# Patient Record
Sex: Female | Born: 1975 | Hispanic: Yes | Smoking: Never smoker
Health system: Southern US, Community
[De-identification: ages and names within clinical notes are randomized; demographics above are authoritative.]

## PROBLEM LIST (undated history)

## (undated) DIAGNOSIS — E7800 Pure hypercholesterolemia, unspecified: Secondary | ICD-10-CM

## (undated) DIAGNOSIS — E119 Type 2 diabetes mellitus without complications: Secondary | ICD-10-CM

## (undated) DIAGNOSIS — D649 Anemia, unspecified: Secondary | ICD-10-CM

---

## 2016-11-08 ENCOUNTER — Emergency Department: Payer: Self-pay

## 2016-11-08 ENCOUNTER — Emergency Department
Admission: EM | Admit: 2016-11-08 | Discharge: 2016-11-09 | Disposition: A | Discharge status: Home / Self Care | Payer: Self-pay | Attending: Emergency Medicine | Admitting: Emergency Medicine

## 2016-11-08 ENCOUNTER — Encounter: Payer: Self-pay | Admitting: *Deleted

## 2016-11-08 ENCOUNTER — Other Ambulatory Visit: Payer: Self-pay

## 2016-11-08 DIAGNOSIS — R079 Chest pain, unspecified: Secondary | ICD-10-CM

## 2016-11-08 DIAGNOSIS — R519 Headache, unspecified: Secondary | ICD-10-CM

## 2016-11-08 DIAGNOSIS — R0602 Shortness of breath: Secondary | ICD-10-CM

## 2016-11-08 DIAGNOSIS — R51 Headache: Secondary | ICD-10-CM | POA: Insufficient documentation

## 2016-11-08 HISTORY — DX: Anemia, unspecified: D64.9

## 2016-11-08 LAB — CBC
HCT: 24.8 % — ABNORMAL LOW (ref 35.0–47.0)
Hemoglobin: 8 g/dL — ABNORMAL LOW (ref 12.0–16.0)
MCH: 18.3 pg — AB (ref 26.0–34.0)
MCHC: 32 g/dL (ref 32.0–36.0)
MCV: 57.1 fL — ABNORMAL LOW (ref 80.0–100.0)
PLATELETS: 406 10*3/uL (ref 150–440)
RBC: 4.35 MIL/uL (ref 3.80–5.20)
RDW: 20.9 % — AB (ref 11.5–14.5)
WBC: 5.6 10*3/uL (ref 3.6–11.0)

## 2016-11-08 LAB — BASIC METABOLIC PANEL
Anion gap: 11 (ref 5–15)
BUN: 9 mg/dL (ref 6–20)
CALCIUM: 9.1 mg/dL (ref 8.9–10.3)
CO2: 28 mmol/L (ref 22–32)
CREATININE: 0.62 mg/dL (ref 0.44–1.00)
Chloride: 102 mmol/L (ref 101–111)
GFR calc Af Amer: >60 mL/min (ref >60)
GFR calc non Af Amer: >60 mL/min (ref >60)
GLUCOSE: 204 mg/dL — AB (ref 65–99)
Potassium: 3.4 mmol/L — ABNORMAL LOW (ref 3.5–5.1)
Sodium: 141 mmol/L (ref 135–145)

## 2016-11-08 LAB — BRAIN NATRIURETIC PEPTIDE: B Natriuretic Peptide: 24 pg/mL (ref 0.0–100.0)

## 2016-11-08 LAB — TROPONIN I

## 2016-11-08 MED ORDER — DIPHENHYDRAMINE HCL 50 MG/ML IJ SOLN
25.0000 mg | Freq: Once | INTRAMUSCULAR | Status: AC
  Administered 2016-11-08: 25 mg via INTRAVENOUS
  Filled 2016-11-08: qty 1

## 2016-11-08 MED ORDER — METOCLOPRAMIDE HCL 5 MG/ML IJ SOLN
10.0000 mg | Freq: Once | INTRAMUSCULAR | Status: AC
  Administered 2016-11-08: 10 mg via INTRAVENOUS
  Filled 2016-11-08: qty 2

## 2016-11-08 NOTE — ED Triage Notes (Signed)
Via video interpreter, pt c/o headache, pain in the neck neck, shoulders, "tight/pressure" chest pain "everything is just hurting". "I am really short of breath". Pt also says that she has dizziness and nausea. Symptoms x 3 days, today worse.

## 2016-11-08 NOTE — ED Notes (Signed)
Pt c/o h/a, neck pain, back pain, bilateral arm numbness. Pt sts that pain been ongoing x 3 days, c/o dizzness and nausea. +PO intake.

## 2016-11-08 NOTE — ED Notes (Addendum)
ED Provider, MD Zenda Alpers, at bedside.

## 2016-11-09 ENCOUNTER — Encounter: Payer: Self-pay | Admitting: Radiology

## 2016-11-09 ENCOUNTER — Emergency Department: Payer: Self-pay

## 2016-11-09 LAB — FIBRIN DERIVATIVES D-DIMER (ARMC ONLY): FIBRIN DERIVATIVES D-DIMER (ARMC): 343.08 (ref 0.00–499.00)

## 2016-11-09 LAB — TROPONIN I

## 2016-11-09 MED ORDER — IOPAMIDOL (ISOVUE-370) INJECTION 76%
125.0000 mL | Freq: Once | INTRAVENOUS | Status: AC | PRN
  Administered 2016-11-09: 125 mL via INTRAVENOUS

## 2016-11-09 MED ORDER — KETOROLAC TROMETHAMINE 30 MG/ML IJ SOLN
30.0000 mg | Freq: Once | INTRAMUSCULAR | Status: AC
  Administered 2016-11-09: 30 mg via INTRAVENOUS
  Filled 2016-11-09: qty 1

## 2016-11-09 MED ORDER — BUTALBITAL-APAP-CAFFEINE 50-325-40 MG PO TABS: 1.0000 | ORAL_TABLET | Freq: Four times a day (QID) | ORAL | 0 refills | Status: AC | PRN

## 2016-11-09 NOTE — ED Provider Notes (Signed)
[facility] Emergency Department Provider Note   ____________________________________________   First MD Initiated Contact with Patient 11/08/16 2326     (approximate)  I have reviewed the triage vital signs and the nursing notes.   HISTORY  Chief Complaint Shortness of Breath    HPI Erika Everett is a 41 y.o. female who comes into the hospital today with a headache chest pain and back pain. She reports that the symptoms have been. Bad for 3 days. She was trying to make an appointment to see her physician but they did not have one for over a month. The patient has been taking Tylenol for the pain but has not been helping. The patient rates her pain a 9 out of 10 in intensity in her head. She reports that at the left side of her head and seems to go around her head. It also seems to go down her left neck and into her shoulder. The patient does not have a history of migraines or headaches like this. She denies any blurred vision but has been having some numbness in her arms as well as weakness all over her body. The patient is also having some shortness of breath with pain in her mid chest radiating through to her back. She's had some nausea and dizziness but no vomiting or abdominal pain. The patient denies any blurred vision. The patient was concerned about the symptoms that she decided to come into the hospital for further evaluation.   Past Medical History:  Diagnosis Date  . Anemia     There are no active problems to display for this patient.   Past Surgical History:  Procedure Laterality Date  . CESAREAN SECTION      Prior to Admission medications   Medication Sig Start Date End Date Taking? Authorizing Provider  butalbital-acetaminophen-caffeine (FIORICET, ESGIC) [phone] MG tablet Take 1-2 tablets by mouth every 6 (six) hours as needed for headache. 11/09/16 11/09/17  Rebecka Apley, MD    Allergies Patient has no known  allergies.  No family history on file.  Social History Social History  Substance Use Topics  . Smoking status: Never Smoker  . Smokeless tobacco: Never Used  . Alcohol use No    Review of Systems  Constitutional: No fever/chills Eyes: No visual changes. ENT: No sore throat. Cardiovascular:  chest pain. Respiratory:  shortness of breath. Gastrointestinal: No abdominal pain.  No nausea, no vomiting.  No diarrhea.  No constipation. Genitourinary: Negative for dysuria. Musculoskeletal:  back pain. Skin: Negative for rash. Neurological: Headache   ____________________________________________   PHYSICAL EXAM:  VITAL SIGNS: ED Triage Vitals  Enc Vitals Group     BP 11/08/16 2148 (!) 156/92     Pulse Rate 11/08/16 2148 88     Resp 11/08/16 2148 18     Temp 11/08/16 2148 98.6 F (37 C)     Temp Source 11/08/16 2148 Oral     SpO2 11/08/16 2148 100 %     Weight 11/08/16 2149 195 lb (88.5 kg)     Height 11/08/16 2149  (1.575 m)     Head Circumference --      Peak Flow --      Pain Score 11/08/16 2146 10     Pain Loc --      Pain Edu? --      Excl. in GC? --     Constitutional: Alert and oriented. Well appearing and in Moderate distress. Eyes: Conjunctivae are normal.  PERRL. EOMI. Head: Atraumatic. Nose: No congestion/rhinnorhea. Mouth/Throat: Mucous membranes are moist.  Oropharynx non-erythematous. Cardiovascular: Normal rate, regular rhythm. Grossly normal heart sounds.  Good peripheral circulation. Respiratory: Normal respiratory effort.  No retractions. Lungs CTAB. Gastrointestinal: Soft and nontender. No distention. Positive bowel sounds Musculoskeletal: No lower extremity tenderness nor edema.   Neurologic:  Normal speech and language. Cranial nerves II through XII are grossly intact with no focal motor or neuro deficits Skin:  Skin is warm, dry and intact. Marland Kitchen Psychiatric: Mood and affect are normal.   ____________________________________________    LABS (all labs ordered are listed, but only abnormal results are displayed)  Labs Reviewed  BASIC METABOLIC PANEL - Abnormal; Notable for the following:       Result Value   Potassium 3.4 (*)    Glucose, Bld 204 (*)    All other components within normal limits  CBC - Abnormal; Notable for the following:    Hemoglobin 8.0 (*)    HCT 24.8 (*)    MCV 57.1 (*)    MCH 18.3 (*)    RDW 20.9 (*)    All other components within normal limits  TROPONIN I  BRAIN NATRIURETIC PEPTIDE  FIBRIN DERIVATIVES D-DIMER ([facility] ONLY)  TROPONIN I   ____________________________________________  EKG  ED ECG REPORT I, Rebecka Apley, the attending physician, personally viewed and interpreted this ECG.   Date: 11/09/2016  EKG Time: 2148  Rate: 85  Rhythm: normal sinus rhythm  Axis: normal  Intervals:none  ST&T Change: none  ____________________________________________  RADIOLOGY  CT head CTA head and neck CTA chest ____________________________________________   PROCEDURES  Procedure(s) performed: None  Procedures  Critical Care performed: No  ____________________________________________   INITIAL IMPRESSION / ASSESSMENT AND PLAN / ED COURSE  Pertinent labs & imaging results that were available during my care of the patient were reviewed by me and considered in my medical decision making (see chart for details).  This is a 41 year old female who comes into the hospital today with some headache chest pain as well as back pain. The patient reports that it has been severe and she is unable to tolerate it. The patient is also had some shortness of breath. The patient is anemic with a hemoglobin of 8.0 and a hematocrit of 24.8. She has had a hemoglobin down to 7.5 back in 2014 but in 2017 the patient's hemoglobin was 10. The patient's ENP is normal and her troponin is unremarkable. The patient's CT is unremarkable as well but I will await the results of her CT angios. The patient did  receive some Reglan, Benadryl and Toradol for her symptoms.  Clinical Course as of Nov 10 418  Mon Nov 08, 2016  2313 No active cardiopulmonary disease. DG Chest 2 View [AW]  Tue Nov 09, 2016  0040 Unremarkable noncontrast CT of the head. CT Head Wo Contrast [AW]  0257 Normal CTA of the head and neck. CT Angio Head W or Wo Contrast [AW]  0321 Artifact versus pulmonary embolus in the anterior basal left lower lobe pulmonary artery branch. Clinical correlation is recommended. V/Q scan may provide better evaluation if there is high clinical concern for PE. No other acute intrathoracic pathology identified.   CT Angio Chest Aorta W and/or Wo Contrast [AW]    Clinical Course User Index [AW] Rebecka Apley, MD    After receiving the medication the patient feels improved. She has had some problems with anemia in the past. I did receive a call  from the radiologist saying that there is a possibility she could have some artifact in the left lower lobe pulmonary artery branch but he could not completely exclude a pulmonary embolus. Again the patient does not have any pleuritic pain. She reports that her breathing and her chest pain is improved at this time. Given that her d-dimer is also negative I will not start the patient on any anticoagulation. Her vital signs are unremarkable with no hypoxia and tachycardia or increased respiratory rate. I feel the patient should follow back up with her primary care physician for further evaluation. I informed the patient of this and she understands and agrees with this plan. I informed her that she should return if her symptoms worsen or she has any other concerns or new symptoms. The patient will be discharged home to follow-up. ____________________________________________   FINAL CLINICAL IMPRESSION(S) / ED DIAGNOSES  Final diagnoses:  Shortness of breath  Chest pain, unspecified type  Nonintractable headache, unspecified chronicity pattern, unspecified  headache type      NEW MEDICATIONS STARTED DURING THIS VISIT:  New Prescriptions   BUTALBITAL-ACETAMINOPHEN-CAFFEINE (FIORICET, ESGIC) [phone] MG TABLET    Take 1-2 tablets by mouth every 6 (six) hours as needed for headache.     Note:  This document was prepared using Dragon voice recognition software and may include unintentional dictation errors.    Rebecka Apley, MD 11/09/16 [phone]

## 2016-11-09 NOTE — ED Notes (Signed)
Report given to Kuch RN 

## 2016-11-09 NOTE — ED Notes (Signed)
Patient transported to CT 

## 2017-02-04 ENCOUNTER — Emergency Department
Admission: EM | Admit: 2017-02-04 | Discharge: 2017-02-05 | Disposition: A | Discharge status: Home / Self Care | Payer: Self-pay | Attending: Emergency Medicine | Admitting: Emergency Medicine

## 2017-02-04 ENCOUNTER — Encounter: Payer: Self-pay | Admitting: Emergency Medicine

## 2017-02-04 DIAGNOSIS — R110 Nausea: Secondary | ICD-10-CM

## 2017-02-04 DIAGNOSIS — R109 Unspecified abdominal pain: Secondary | ICD-10-CM

## 2017-02-04 DIAGNOSIS — R739 Hyperglycemia, unspecified: Secondary | ICD-10-CM

## 2017-02-04 DIAGNOSIS — N926 Irregular menstruation, unspecified: Secondary | ICD-10-CM

## 2017-02-04 LAB — COMPREHENSIVE METABOLIC PANEL
ALBUMIN: 4 g/dL (ref 3.5–5.0)
ALK PHOS: 83 U/L (ref 38–126)
ALT: 60 U/L — ABNORMAL HIGH (ref 14–54)
ANION GAP: 5 (ref 5–15)
AST: 55 U/L — ABNORMAL HIGH (ref 15–41)
BILIRUBIN TOTAL: 0.4 mg/dL (ref 0.3–1.2)
BUN: 11 mg/dL (ref 6–20)
CALCIUM: 9 mg/dL (ref 8.9–10.3)
CO2: 22 mmol/L (ref 22–32)
Chloride: 105 mmol/L (ref 101–111)
Creatinine, Ser: 0.48 mg/dL (ref 0.44–1.00)
GFR calc non Af Amer: >60 mL/min (ref >60)
GLUCOSE: 288 mg/dL — AB (ref 65–99)
POTASSIUM: 3.5 mmol/L (ref 3.5–5.1)
SODIUM: 132 mmol/L — AB (ref 135–145)
TOTAL PROTEIN: 7.8 g/dL (ref 6.5–8.1)

## 2017-02-04 LAB — URINALYSIS, COMPLETE (UACMP) WITH MICROSCOPIC
Bilirubin Urine: NEGATIVE
Glucose, UA: >=500 mg/dL — AB
Ketones, ur: 5 mg/dL — AB
Leukocytes, UA: NEGATIVE
Nitrite: NEGATIVE
PH: 5 (ref 5.0–8.0)
Protein, ur: NEGATIVE mg/dL
SPECIFIC GRAVITY, URINE: 1.029 (ref 1.005–1.030)

## 2017-02-04 LAB — CBC
HEMATOCRIT: 30.3 % — AB (ref 35.0–47.0)
HEMOGLOBIN: 9.5 g/dL — AB (ref 12.0–16.0)
MCH: 19.4 pg — ABNORMAL LOW (ref 26.0–34.0)
MCHC: 31.3 g/dL — ABNORMAL LOW (ref 32.0–36.0)
MCV: 61.9 fL — ABNORMAL LOW (ref 80.0–100.0)
Platelets: 340 10*3/uL (ref 150–440)
RBC: 4.89 MIL/uL (ref 3.80–5.20)
RDW: 21.7 % — ABNORMAL HIGH (ref 11.5–14.5)
WBC: 7.1 10*3/uL (ref 3.6–11.0)

## 2017-02-04 LAB — LIPASE, BLOOD: Lipase: 27 U/L (ref 11–51)

## 2017-02-04 MED ORDER — ONDANSETRON 4 MG PO TBDP
4.0000 mg | ORAL_TABLET | Freq: Once | ORAL | Status: AC | PRN
  Administered 2017-02-04: 4 mg via ORAL
  Filled 2017-02-04: qty 1

## 2017-02-04 NOTE — ED Triage Notes (Signed)
Pt ambulatory to triage with no difficulty. Pt reports approx 1 week of lower abd pain with nausea. Denies vomiting or diarrhea. Pt reprots she normally has heavy periods but for the last 2 months her periods have been irregular and she has been having small amount of bleeding every day.

## 2017-02-05 MED ORDER — DICYCLOMINE HCL 10 MG PO CAPS: 10.0000 mg | ORAL_CAPSULE | Freq: Four times a day (QID) | ORAL | 0 refills | Status: AC | PRN

## 2017-02-05 MED ORDER — ONDANSETRON 4 MG PO TBDP
4.0000 mg | ORAL_TABLET | Freq: Once | ORAL | Status: DC
Start: 2017-02-05 — End: 2017-02-05

## 2017-02-05 MED ORDER — ONDANSETRON 4 MG PO TBDP: ORAL_TABLET | ORAL | 0 refills | Status: AC

## 2017-02-05 NOTE — ED Notes (Signed)
Pt left prior to d/c instruction. Will call and leave message and d/c instruction with first nurse if wanting to pick up. York CeriseForbach MD aware.

## 2017-02-05 NOTE — ED Provider Notes (Signed)
[facility] Emergency Department Provider Note  ____________________________________________   First MD Initiated Contact with Patient 02/05/17 0025     (approximate)  I have reviewed the triage vital signs and the nursing notes.   HISTORY  Chief Complaint Abdominal Pain  The patient and/or family speak(s) Spanish.  They understand they have the right to the use of a hospital interpreter, however at this time they prefer to speak directly with me in Spanish.  They know that they can ask for an interpreter at any time.   HPI Erika Everett is a 41 y.o. female with a medical history of anemia and irregular periods who presents for evaluation of nausea, intermittent lower abdominal cramping, and intermittent dizziness.  The symptoms started about a week ago.  The nausea is persistent and nothing in particular makes it better or worse and she describes it as severe.  She has had no vomiting, diarrhea, nor constipation.  The lower abdominal cramping is intermittent and mild and states Tylenol helps.  She has had a heavy period for about a week and states she has a history of irregular periods.  She feels that the cramping she is experiencing does not feel like it is related to her menstrual cycle.  She denies fever/chills, chest pain, shortness of breath, vomiting, dysuria.  She has had 3 cesarean sections but no other abdominal surgeries.  She has no history of diabetes.   Past Medical History:  Diagnosis Date  . Anemia     There are no active problems to display for this patient.   Past Surgical History:  Procedure Laterality Date  . CESAREAN SECTION      Prior to Admission medications   Medication Sig Start Date End Date Taking? Authorizing Provider  butalbital-acetaminophen-caffeine (FIORICET, ESGIC) [phone] MG tablet Take 1-2 tablets by mouth every 6 (six) hours as needed for headache. 11/09/16 11/09/17  Rebecka Apley, MD  dicyclomine  (BENTYL) 10 MG capsule Take 1 capsule (10 mg total) by mouth 4 (four) times daily as needed for spasms (cramping). 02/05/17 02/19/17  Loleta Rose, MD  ondansetron (ZOFRAN ODT) 4 MG disintegrating tablet Allow 1-2 tablets to dissolve in your mouth every 8 hours as needed for nausea/vomiting 02/05/17   Loleta Rose, MD    Allergies Rubbing alcohol [alcohol]  History reviewed. No pertinent family history.  Social History Social History  Substance Use Topics  . Smoking status: Never Smoker  . Smokeless tobacco: Never Used  . Alcohol use No    Review of Systems Constitutional: No fever/chills Eyes: No visual changes. ENT: No sore throat. Cardiovascular: Denies chest pain. Respiratory: Denies shortness of breath. Gastrointestinal: No abdominal pain.  No nausea, no vomiting.  No diarrhea.  No constipation. Genitourinary: Negative for dysuria. Musculoskeletal: Negative for neck pain.  Negative for back pain. Integumentary: Negative for rash. Neurological: Negative for headaches, focal weakness or numbness.   ____________________________________________   PHYSICAL EXAM:  VITAL SIGNS: ED Triage Vitals  Enc Vitals Group     BP 02/04/17 1916 (!) 145/75     Pulse Rate 02/04/17 1916 92     Resp 02/04/17 1916 20     Temp 02/04/17 1916 99.1 F (37.3 C)     Temp Source 02/04/17 1916 Oral     SpO2 02/04/17 1916 100 %     Weight 02/04/17 1917 88.9 kg (196 lb)     Height 02/04/17 1917 1.6 m (5\' 3" )     Head Circumference --  Peak Flow --      Pain Score 02/04/17 1915 7     Pain Loc --      Pain Edu? --      Excl. in GC? --     Constitutional: Alert and oriented. Well appearing and in no acute distress. Eyes: Conjunctivae are normal.  Head: Atraumatic. Nose: No congestion/rhinnorhea. Mouth/Throat: Mucous membranes are moist. Neck: No stridor.  No meningeal signs.   Cardiovascular: Normal rate, regular rhythm. Good peripheral circulation. Grossly normal heart  sounds. Respiratory: Normal respiratory effort.  No retractions. Lungs CTAB. Gastrointestinal: Soft With mild suprapubic tenderness to palpation.  No rebound and no guarding. GU:  deferred Musculoskeletal: No lower extremity tenderness nor edema. No gross deformities of extremities. Neurologic:  Normal speech and language. No gross focal neurologic deficits are appreciated.  Skin:  Skin is warm, dry and intact. No rash noted. Psychiatric: Mood and affect are normal. Speech and behavior are normal.  ____________________________________________   LABS (all labs ordered are listed, but only abnormal results are displayed)  Labs Reviewed  COMPREHENSIVE METABOLIC PANEL - Abnormal; Notable for the following:       Result Value   Sodium 132 (*)    Glucose, Bld 288 (*)    AST 55 (*)    ALT 60 (*)    All other components within normal limits  CBC - Abnormal; Notable for the following:    Hemoglobin 9.5 (*)    HCT 30.3 (*)    MCV 61.9 (*)    MCH 19.4 (*)    MCHC 31.3 (*)    RDW 21.7 (*)    All other components within normal limits  URINALYSIS, COMPLETE (UACMP) WITH MICROSCOPIC - Abnormal; Notable for the following:    Color, Urine YELLOW (*)    APPearance CLEAR (*)    Glucose, UA >=500 (*)    Hgb urine dipstick MODERATE (*)    Ketones, ur 5 (*)    Bacteria, UA RARE (*)    Squamous Epithelial / LPF 0-5 (*)    All other components within normal limits  LIPASE, BLOOD  POC URINE PREG, ED   ____________________________________________  EKG  None - EKG not ordered by ED physician ____________________________________________  RADIOLOGY   No results found.  ____________________________________________   PROCEDURES  Critical Care performed: No   Procedure(s) performed:   Procedures   ____________________________________________   INITIAL IMPRESSION / ASSESSMENT AND PLAN / ED COURSE  Pertinent labs & imaging results that were available during my care of the patient  were reviewed by me and considered in my medical decision making (see chart for details).  The patient's workup was reassuring today.  I informed her that her glucose level is high and she needs to follow up with her regular doctor for additional testing to determine if she has diabetes.  He is anemic but she has a history of anemia and her hemoglobin is actually considerably higher than the last time it was checked in our system.  I suspect that her symptoms are related to her persistent./Irregular vaginal bleeding.  I offered to order a transvaginal ultrasound as I suspect she may have fibroids or other nonacute but relevant abnormality that may be leading to her symptoms but she is tired because she has been waiting for 5-1/2 hours and wants to go home.  I will provide her with a prescription for Zofran as well as a prescription for Bentyl in case this helps with her abdominal cramping and encouraged her  to follow up both with her primary care doctor and with OB/GYN to discuss her symptoms.  I gave my usual and customary return precautions.  She understands and agrees with the plan.      ____________________________________________  FINAL CLINICAL IMPRESSION(S) / ED DIAGNOSES  Final diagnoses:  Nausea  Abdominal cramping  Irregular menstrual cycle  Hyperglycemia     MEDICATIONS GIVEN DURING THIS VISIT:  Medications  ondansetron (ZOFRAN-ODT) disintegrating tablet 4 mg (not administered)  ondansetron (ZOFRAN-ODT) disintegrating tablet 4 mg (4 mg Oral Given 02/04/17 1928)     NEW OUTPATIENT MEDICATIONS STARTED DURING THIS VISIT:  New Prescriptions   DICYCLOMINE (BENTYL) 10 MG CAPSULE    Take 1 capsule (10 mg total) by mouth 4 (four) times daily as needed for spasms (cramping).   ONDANSETRON (ZOFRAN ODT) 4 MG DISINTEGRATING TABLET    Allow 1-2 tablets to dissolve in your mouth every 8 hours as needed for nausea/vomiting    Modified Medications   No medications on file     Discontinued Medications   No medications on file     Note:  This document was prepared using Dragon voice recognition software and may include unintentional dictation errors.    Loleta RoseForbach, Nathin Saran, MD 02/05/17 [id number]

## 2017-02-05 NOTE — ED Notes (Signed)
Unable to leave VM. D/c instructions given to first nurse.

## 2017-02-05 NOTE — ED Notes (Signed)
Left prior to dc instructions

## 2017-02-05 NOTE — ED Notes (Signed)
Pt not in room. Pt to be d/c per MD order. No answer when called phone.

## 2017-02-05 NOTE — Discharge Instructions (Signed)
Your workup was reassuring today.  The only abnormality was found was that your blood sugar is elevated.  You should follow up with your regular doctor to have additional testing to determine if you have diabetes.  Regarding your irregular period, persistent nausea, and intermittent abdominal cramps, we suggested an ultrasound, but you prefer to follow up as an outpatient.  Please follow up as recommended.    Return to the emergency department if you develop new or worsening symptoms that concern you.

## 2017-02-07 LAB — POCT PREGNANCY, URINE: Preg Test, Ur: NEGATIVE

## 2017-10-21 ENCOUNTER — Emergency Department
Admission: EM | Admit: 2017-10-21 | Discharge: 2017-10-21 | Disposition: A | Discharge status: Home / Self Care | Payer: Self-pay | Attending: Emergency Medicine | Admitting: Emergency Medicine

## 2017-10-21 ENCOUNTER — Emergency Department: Payer: Self-pay

## 2017-10-21 ENCOUNTER — Encounter: Payer: Self-pay | Admitting: Emergency Medicine

## 2017-10-21 ENCOUNTER — Other Ambulatory Visit: Payer: Self-pay

## 2017-10-21 DIAGNOSIS — Z7984 Long term (current) use of oral hypoglycemic drugs: Secondary | ICD-10-CM | POA: Insufficient documentation

## 2017-10-21 DIAGNOSIS — R1012 Left upper quadrant pain: Secondary | ICD-10-CM | POA: Insufficient documentation

## 2017-10-21 DIAGNOSIS — R079 Chest pain, unspecified: Secondary | ICD-10-CM | POA: Insufficient documentation

## 2017-10-21 DIAGNOSIS — Z79899 Other long term (current) drug therapy: Secondary | ICD-10-CM | POA: Insufficient documentation

## 2017-10-21 DIAGNOSIS — E119 Type 2 diabetes mellitus without complications: Secondary | ICD-10-CM | POA: Insufficient documentation

## 2017-10-21 HISTORY — DX: Pure hypercholesterolemia, unspecified: E78.00

## 2017-10-21 HISTORY — DX: Type 2 diabetes mellitus without complications: E11.9

## 2017-10-21 LAB — TROPONIN I: Troponin I: <0.03 ng/mL (ref <0.03)

## 2017-10-21 LAB — CBC
HCT: 28.7 % — ABNORMAL LOW (ref 35.0–47.0)
HEMOGLOBIN: 8.5 g/dL — AB (ref 12.0–16.0)
MCH: 16.5 pg — ABNORMAL LOW (ref 26.0–34.0)
MCHC: 29.8 g/dL — AB (ref 32.0–36.0)
MCV: 55.5 fL — AB (ref 80.0–100.0)
Platelets: 439 10*3/uL (ref 150–440)
RBC: 5.17 MIL/uL (ref 3.80–5.20)
RDW: 25.6 % — ABNORMAL HIGH (ref 11.5–14.5)
WBC: 4.5 10*3/uL (ref 3.6–11.0)

## 2017-10-21 LAB — URINALYSIS, COMPLETE (UACMP) WITH MICROSCOPIC
BACTERIA UA: NONE SEEN
BILIRUBIN URINE: NEGATIVE
Glucose, UA: >=500 mg/dL — AB
KETONES UR: 5 mg/dL — AB
LEUKOCYTES UA: NEGATIVE
Nitrite: NEGATIVE
PROTEIN: NEGATIVE mg/dL
Specific Gravity, Urine: 1.041 — ABNORMAL HIGH (ref 1.005–1.030)
pH: 5 (ref 5.0–8.0)

## 2017-10-21 LAB — BASIC METABOLIC PANEL
ANION GAP: 9 (ref 5–15)
BUN: 7 mg/dL (ref 6–20)
CALCIUM: 9.1 mg/dL (ref 8.9–10.3)
CHLORIDE: 105 mmol/L (ref 101–111)
CO2: 22 mmol/L (ref 22–32)
CREATININE: 0.41 mg/dL — AB (ref 0.44–1.00)
GFR calc non Af Amer: >60 mL/min (ref >60)
GLUCOSE: 288 mg/dL — AB (ref 65–99)
Potassium: 3.8 mmol/L (ref 3.5–5.1)
Sodium: 136 mmol/L (ref 135–145)

## 2017-10-21 LAB — HEPATIC FUNCTION PANEL
ALT: 67 U/L — AB (ref 14–54)
AST: 50 U/L — ABNORMAL HIGH (ref 15–41)
Albumin: 4 g/dL (ref 3.5–5.0)
Alkaline Phosphatase: 87 U/L (ref 38–126)
TOTAL PROTEIN: 7.6 g/dL (ref 6.5–8.1)
Total Bilirubin: 0.4 mg/dL (ref 0.3–1.2)

## 2017-10-21 LAB — LIPASE, BLOOD: LIPASE: 28 U/L (ref 11–51)

## 2017-10-21 LAB — GLUCOSE, CAPILLARY: GLUCOSE-CAPILLARY: 280 mg/dL — AB (ref 65–99)

## 2017-10-21 LAB — POCT PREGNANCY, URINE: Preg Test, Ur: NEGATIVE

## 2017-10-21 MED ORDER — ONDANSETRON HCL 4 MG/2ML IJ SOLN
4.0000 mg | Freq: Once | INTRAMUSCULAR | Status: AC
  Administered 2017-10-21: 4 mg via INTRAVENOUS
  Filled 2017-10-21: qty 2

## 2017-10-21 MED ORDER — IOPAMIDOL (ISOVUE-300) INJECTION 61%
100.0000 mL | Freq: Once | INTRAVENOUS | Status: AC | PRN
  Administered 2017-10-21: 100 mL via INTRAVENOUS

## 2017-10-21 MED ORDER — FAMOTIDINE 20 MG PO TABS: 20.0000 mg | ORAL_TABLET | Freq: Two times a day (BID) | ORAL | 0 refills | Status: AC

## 2017-10-21 MED ORDER — SODIUM CHLORIDE 0.9 % IV BOLUS
1000.0000 mL | Freq: Once | INTRAVENOUS | Status: AC
  Administered 2017-10-21: 1000 mL via INTRAVENOUS

## 2017-10-21 MED ORDER — IOPAMIDOL (ISOVUE-300) INJECTION 61%
30.0000 mL | Freq: Once | INTRAVENOUS | Status: AC
  Administered 2017-10-21: 30 mL via ORAL

## 2017-10-21 MED ORDER — KETOROLAC TROMETHAMINE 30 MG/ML IJ SOLN
15.0000 mg | INTRAMUSCULAR | Status: AC
  Administered 2017-10-21: 15 mg via INTRAVENOUS
  Filled 2017-10-21: qty 1

## 2017-10-21 MED ORDER — METOCLOPRAMIDE HCL 10 MG PO TABS: 10.0000 mg | ORAL_TABLET | Freq: Four times a day (QID) | ORAL | 0 refills | Status: AC | PRN

## 2017-10-21 NOTE — ED Triage Notes (Signed)
Patient presents to the ED with chest pain that began this morning.  Patient reports intermittent weakness and palpitations since yesterday.  Patient states she has history of anemia and diabetes.  Patient reports her chest pain radiates into her neck and head and down into her arm.  Patient reports numbness in last 3 fingers in left hand.

## 2017-10-21 NOTE — Discharge Instructions (Signed)
Your blood tests, chest xray, and CT scan of the abdomen were unremarkable today.  There is no clear cause of your pain today.  It may be related to acid reflux, which can be improved with famotidine and reglan.  Follow up with your doctor and cardiology for further evaluation of your symptoms.

## 2017-10-21 NOTE — ED Triage Notes (Signed)
FIRST NURSE NOTE-here for CP. Pulled for EKG. NAD. Ambulatory.

## 2017-10-21 NOTE — ED Notes (Signed)
Pt c/o "squeezing" chest pain that started this morning around 0600. Pt states that she is still having squeezing pain in her left chest and left back. Pt states that she has had nausea but no vomiting. Pt denies diarrhea. Pt in NAD at this time.

## 2017-10-21 NOTE — ED Notes (Signed)
Patient transported to X-ray 

## 2017-10-21 NOTE — ED Provider Notes (Signed)
[facility] Emergency Department Provider Note  ____________________________________________  Time seen: Approximately 6:41 PM  I have reviewed the triage vital signs and the nursing notes.   HISTORY  Chief Complaint Chest Pain    HPI Erika Everett is a 42 y.o. female with a history of diabetes, high cholesterol, anemia comes the ED complaining of a squeezing chest pain that started at about 6:00 AM this morning, constant, radiating  around the left mid back. when asked to point where the pain is, patient actually indicates the left upper quadrant. No vomiting diaphoresis or shortness of breath. No dizziness or syncope. Discomfort is still present. Worse with eating, no alleviating factors.     Past Medical History:  Diagnosis Date  . Anemia   . Diabetes mellitus without complication (HCC)   . High cholesterol      There are no active problems to display for this patient.    Past Surgical History:  Procedure Laterality Date  . CESAREAN SECTION       Prior to Admission medications   Medication Sig Start Date End Date Taking? Authorizing Provider  metFORMIN (GLUCOPHAGE) 500 MG tablet Take 1,000 mg by mouth 2 (two) times daily with a meal.   Yes [provider]  butalbital-acetaminophen-caffeine (FIORICET, ESGIC) 50-325-40 MG tablet Take 1-2 tablets by mouth every 6 (six) hours as needed for headache. Patient not taking: Reported on 10/21/2017 11/09/16 11/09/17  Rebecka ApleyWebster, Allison P, MD  dicyclomine (BENTYL) 10 MG capsule Take 1 capsule (10 mg total) by mouth 4 (four) times daily as needed for spasms (cramping). 02/05/17 02/19/17  Loleta RoseForbach, Cory, MD  famotidine (PEPCID) 20 MG tablet Take 1 tablet (20 mg total) by mouth 2 (two) times daily. 10/21/17   Sharman CheekStafford, Dutch Ing, MD  metoCLOPramide (REGLAN) 10 MG tablet Take 1 tablet (10 mg total) by mouth every 6 (six) hours as needed. 10/21/17   Sharman CheekStafford, Daren Doswell, MD  ondansetron (ZOFRAN ODT) 4 MG  disintegrating tablet Allow 1-2 tablets to dissolve in your mouth every 8 hours as needed for nausea/vomiting Patient not taking: Reported on 10/21/2017 02/05/17   Loleta RoseForbach, Cory, MD     Allergies Rubbing alcohol [alcohol]   No family history on file.  Social History Social History   Tobacco Use  . Smoking status: Never Smoker  . Smokeless tobacco: Never Used  Substance Use Topics  . Alcohol use: No  . Drug use: No    Review of Systems  Constitutional:   No fever or chills.  ENT:   No sore throat. No rhinorrhea. Cardiovascular:   negative for chest pain or syncope. Respiratory:   No dyspnea or cough. Gastrointestinal:  positive as above abdominal pain without vomiting and diarrhea.  Musculoskeletal:   Negative for focal pain or swelling All other systems reviewed and are negative except as documented above in ROS and HPI.  ____________________________________________   PHYSICAL EXAM:  VITAL SIGNS: ED Triage Vitals  Enc Vitals Group     BP 10/21/17 1533 (!) 141/89     Pulse Rate 10/21/17 1533 81     Resp 10/21/17 1533 18     Temp 10/21/17 1533 98.3 F (36.8 C)     Temp Source 10/21/17 1533 Oral     SpO2 10/21/17 1533 100 %     Weight 10/21/17 1534 187 lb (84.8 kg)     Height 10/21/17 1534 5\' 2"  (1.575 m)     Head Circumference --      Peak Flow --  Pain Score 10/21/17 1534 7     Pain Loc --      Pain Edu? --      Excl. in GC? --     Vital signs reviewed, nursing assessments reviewed.   Constitutional:   Alert and oriented. Well appearing and in no distress. Eyes:   Conjunctivae are normal. EOMI. PERRL. ENT      Head:   Normocephalic and atraumatic.      Nose:   No congestion/rhinnorhea.       Mouth/Throat:   MMM, no pharyngeal erythema. No peritonsillar mass.       Neck:   No meningismus. Full ROM. Hematological/Lymphatic/Immunilogical:   No cervical lymphadenopathy. Cardiovascular:   RRR. Symmetric bilateral radial and DP pulses.  No murmurs.   Respiratory:   Normal respiratory effort without tachypnea/retractions. Breath sounds are clear and equal bilaterally. No wheezes/rales/rhonchi. Gastrointestinal:   Soft with left upper quadrant and left mid abdominal tenderness. Non distended. There is no CVA tenderness.  No rebound, rigidity, or guarding. Musculoskeletal:   Normal range of motion in all extremities. No joint effusions.  No lower extremity tenderness.  No edema.there is tenderness in the left mid back in the area of indicated pain and reproducing the symptoms. No bony point tenderness, no crepitus or chest wall instability. No midline spinal tenderness Neurologic:   Normal speech and language.  Motor grossly intact. No acute focal neurologic deficits are appreciated.  Skin:    Skin is warm, dry and intact. No rash noted.  No petechiae, purpura, or bullae.  ____________________________________________    LABS (pertinent positives/negatives) (all labs ordered are listed, but only abnormal results are displayed) Labs Reviewed  BASIC METABOLIC PANEL - Abnormal; Notable for the following components:      Result Value   Glucose, Bld 288 (*)    Creatinine, Ser 0.41 (*)    All other components within normal limits  CBC - Abnormal; Notable for the following components:   Hemoglobin 8.5 (*)    HCT 28.7 (*)    MCV 55.5 (*)    MCH 16.5 (*)    MCHC 29.8 (*)    RDW 25.6 (*)    All other components within normal limits  URINALYSIS, COMPLETE (UACMP) WITH MICROSCOPIC - Abnormal; Notable for the following components:   Color, Urine YELLOW (*)    APPearance CLEAR (*)    Specific Gravity, Urine 1.041 (*)    Glucose, UA >=500 (*)    Hgb urine dipstick LARGE (*)    Ketones, ur 5 (*)    Squamous Epithelial / LPF 0-5 (*)    All other components within normal limits  GLUCOSE, CAPILLARY - Abnormal; Notable for the following components:   Glucose-Capillary 280 (*)    All other components within normal limits  HEPATIC FUNCTION PANEL -  Abnormal; Notable for the following components:   AST 50 (*)    ALT 67 (*)    Bilirubin, Direct <0.1 (*)    All other components within normal limits  TROPONIN I  LIPASE, BLOOD  TROPONIN I  POC URINE PREG, ED  CBG MONITORING, ED  POCT PREGNANCY, URINE   ____________________________________________   EKG  interpreted by me Normal sinus rhythm rate of 77, normal axis intervals stress ST segments and T waves  ____________________________________________    RADIOLOGY  Dg Chest 2 View  Result Date: 10/21/2017 CLINICAL DATA:  Pt complains of chest pain that began this morning. Patient reports intermittent weakness and palpitations since yesterday. Patient states  she has history of anemia and diabetes. Patient reports her chest pain radiates into her neck and head and down into her arm. Patient reports numbness in last 3 fingers in left hand. EXAM: CHEST - 2 VIEW COMPARISON:  11/08/2016 FINDINGS: The heart size and mediastinal contours are within normal limits. Both lungs are clear. No pleural effusion or pneumothorax. The visualized skeletal structures are unremarkable. IMPRESSION: No active cardiopulmonary disease. Electronically Signed   By: Amie Portland M.D.   On: 10/21/2017 17:08   Ct Abdomen Pelvis W Contrast  Result Date: 10/21/2017 CLINICAL DATA:  Pt c/o "squeezing" chest pain that started this morning around 0600. Pt states that she is still having squeezing pain in her left chest, LUQ abdomen and left back. Pt states that she has had nausea but no vomiting. Pt denies diarrhea. EXAM: CT ABDOMEN AND PELVIS WITH CONTRAST TECHNIQUE: Multidetector CT imaging of the abdomen and pelvis was performed using the standard protocol following bolus administration of intravenous contrast. CONTRAST:  ISOVUE-300 IOPAMIDOL (ISOVUE-300) INJECTION 61% COMPARISON:  None. FINDINGS: Lower chest: Clear lung bases.  Heart normal size. Hepatobiliary: No focal liver abnormality is seen. No gallstones,  gallbladder wall thickening, or biliary dilatation. Pancreas: Unremarkable. No pancreatic ductal dilatation or surrounding inflammatory changes. Spleen: Normal in size without focal abnormality. Adrenals/Urinary Tract: Adrenal glands are unremarkable. Kidneys are normal, without renal calculi, focal lesion, or hydronephrosis. Bladder is unremarkable. Stomach/Bowel: Stomach is within normal limits. Appendix appears normal. No evidence of bowel wall thickening, distention, or inflammatory changes. Vascular/Lymphatic: No significant vascular findings are present. No enlarged abdominal or pelvic lymph nodes. Reproductive: Uterus and bilateral adnexa are unremarkable. Other: No abdominal wall hernia or abnormality. No abdominopelvic ascites. Musculoskeletal: No acute or significant osseous findings. IMPRESSION: 1. Normal enhanced CT scan of the abdomen and pelvis. Electronically Signed   By: Amie Portland M.D.   On: 10/21/2017 18:30    ____________________________________________   PROCEDURES Procedures  ____________________________________________  DIFFERENTIAL DIAGNOSIS   gastritis, diverticulitis, colitis, early shingles, muscular skeletal pain. less likely non-STEMI  CLINICAL IMPRESSION / ASSESSMENT AND PLAN / ED COURSE  Pertinent labs & imaging results that were available during my care of the patient were reviewed by me and considered in my medical decision making (see chart for details).    patient well-appearing no acute distress, presents with unremarkable vital signs. Overall exam is reassuring but does have some left-sided abdominal tenderness. Pain is also reproducible suggesting muscular skeletal origin, but with her poorly controlled diabetes, I'll get a CT scan for further evaluation. I doubt that this is related to ACS PE dissection pericarditis pneumothorax or pneumonia.  ----------------------------------------- 6:51 PM on  10/21/2017 -----------------------------------------  Labs unremarkable, chest x-ray and CT scan abdomen pelvis also unremarkable. Overall workup is reassuring at this time. I get a second troponin for further risk stratification. If Negative, the patient can be safely discharged home to follow up with primary care.   ----------------------------------------- 9:12 PM on 10/21/2017 -----------------------------------------  Second troponin negative, vital signs remain normal. Plan to discharge home. Follow up primary care and cardiology.     ____________________________________________   FINAL CLINICAL IMPRESSION(S) / ED DIAGNOSES    Final diagnoses:  Nonspecific chest pain     ED Discharge Orders        Ordered    famotidine (PEPCID) 20 MG tablet  2 times daily     10/21/17 2110    metoCLOPramide (REGLAN) 10 MG tablet  Every 6 hours PRN  10/21/17 2110      Portions of this note were generated with dragon dictation software. Dictation errors may occur despite best attempts at proofreading.    Sharman Cheek, MD 10/21/17 2112

## 2018-03-19 IMAGING — CT CT ANGIO CHEST
3 of 7 series · 18 of 36 positions shown · IV contrast (APPLIED)
Comparison: Chest radiograph dated 11/08/2016

CLINICAL DATA: 41-year-old female with chest pain radiating to the
shoulder blades.

EXAM:
CT ANGIOGRAPHY CHEST WITH CONTRAST
TECHNIQUE: Multidetector CT imaging of the chest was performed using the
standard protocol during bolus administration of intravenous
contrast. Multiplanar CT image reconstructions and MIPs were
obtained to evaluate the vascular anatomy.
CONTRAST:  125 cc Isovue 370

[Series 14: axial arterial · axial · arterial · 0.74mm/px · z∈[-557,-305]mm · 11 of 102 slices shown]
[im 9/102  lung]
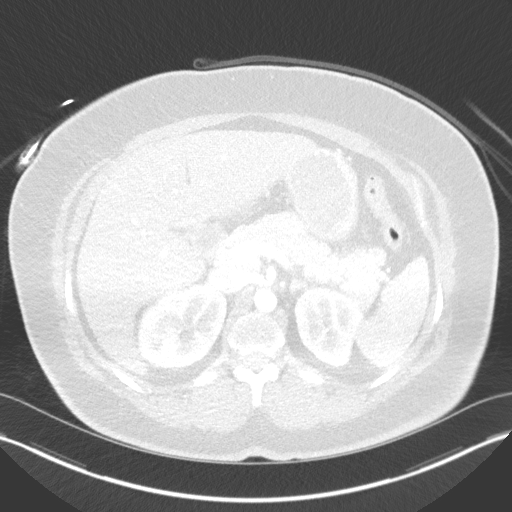
[im 17/102  mediastinal]
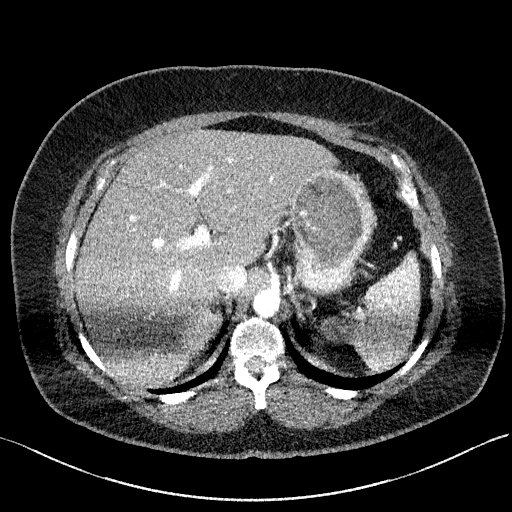
[im 26/102  lung]
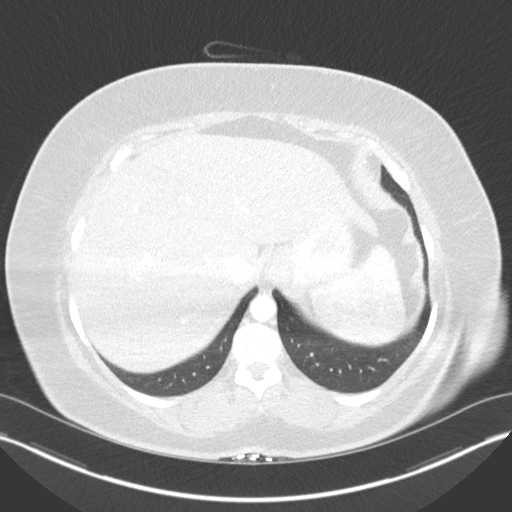
[im 34/102  mediastinal]
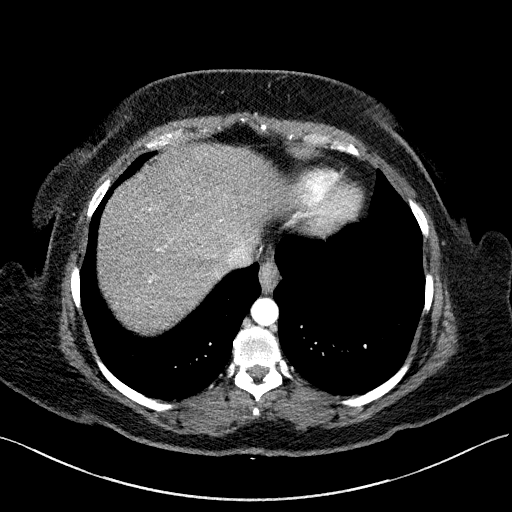
[im 43/102  lung]
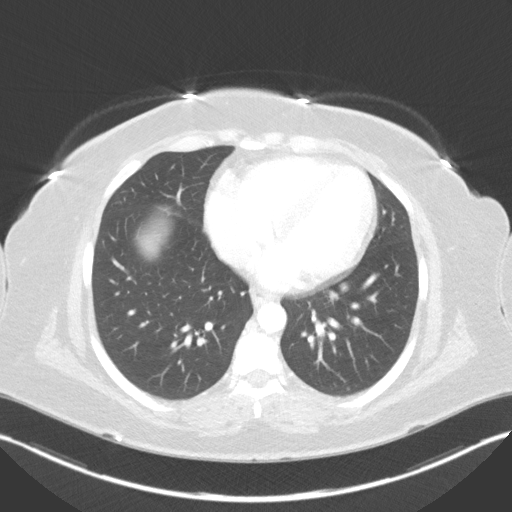
[im 51/102  mediastinal]
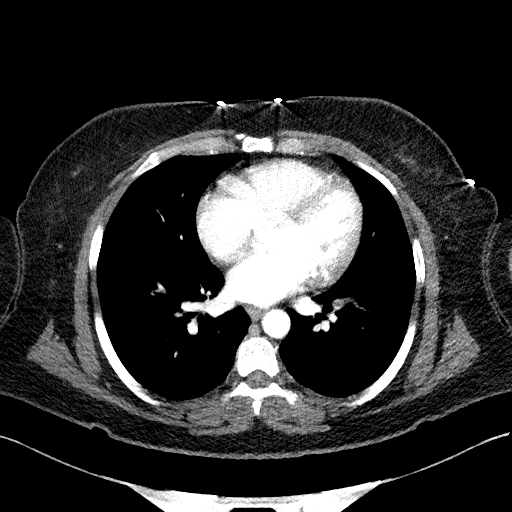
[im 59/102  lung]
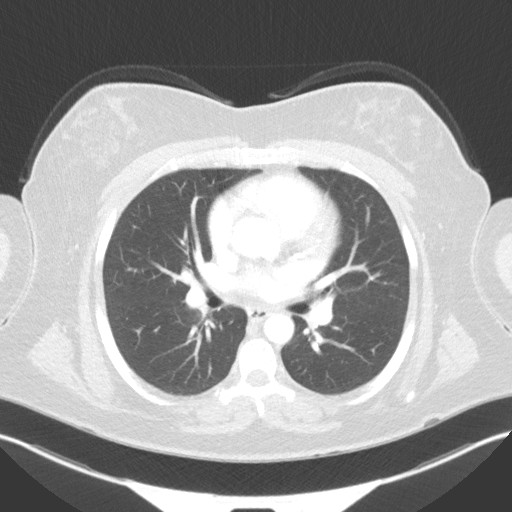
[im 68/102  mediastinal]
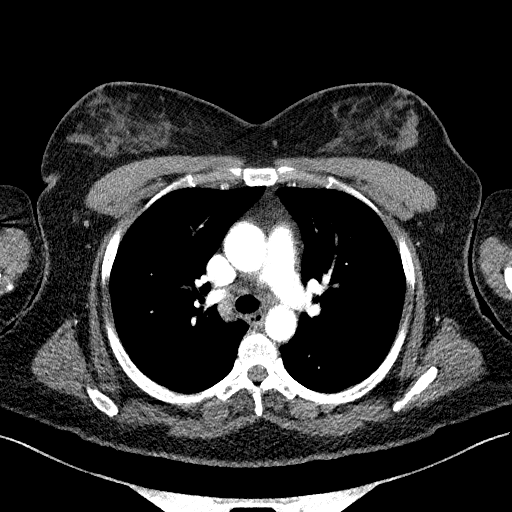
[im 76/102  lung]
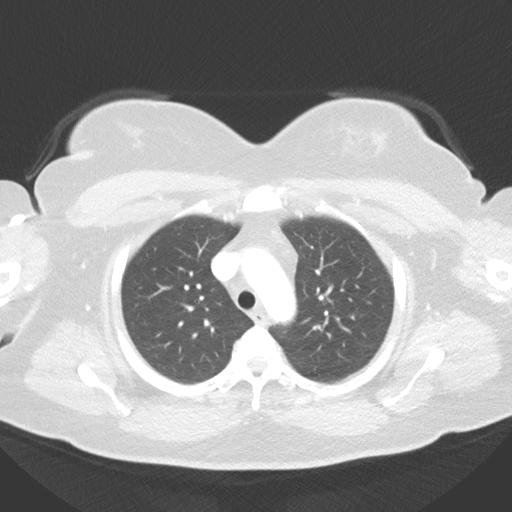
[im 85/102  mediastinal]
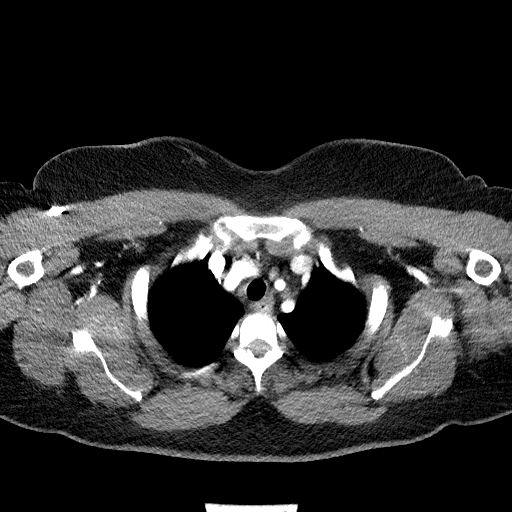
[im 93/102  lung]
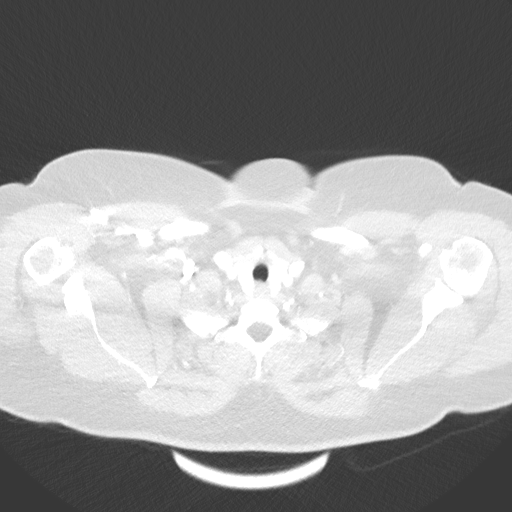

[Series 15: lung · axial · 0.74mm/px · z∈[-538,-323]mm · 6 of 61 slices shown]
[im 9/61  mediastinal]
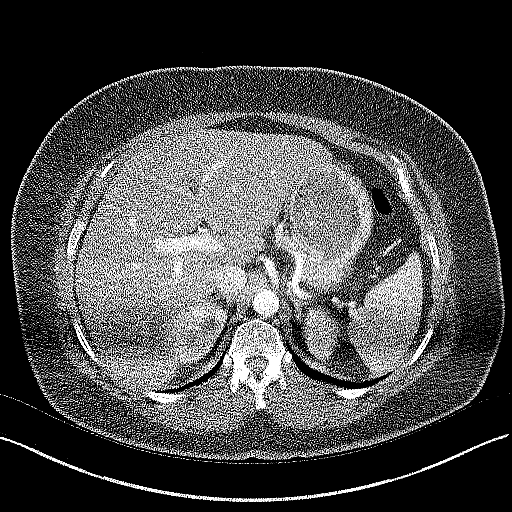
[im 18/61  mediastinal]
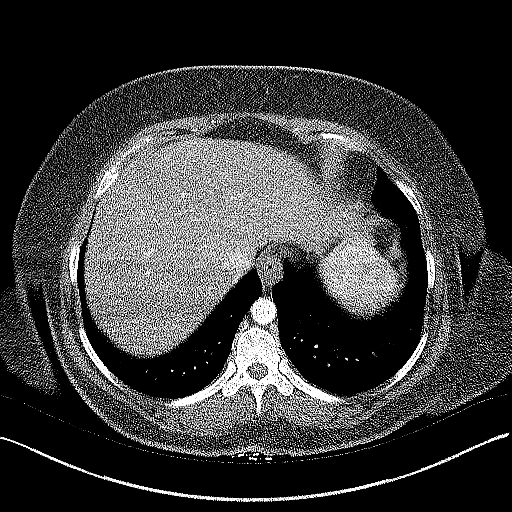
[im 26/61  mediastinal]
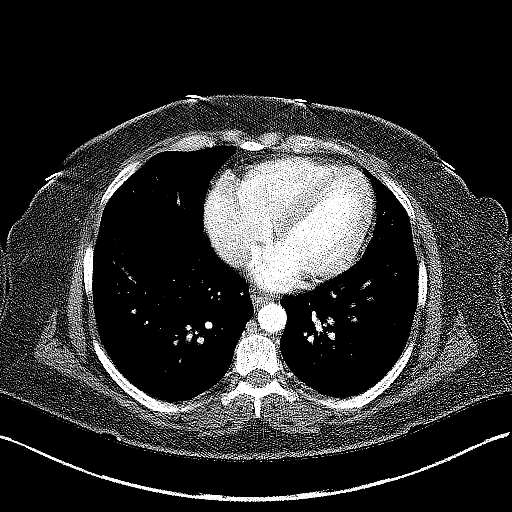
[im 35/61  mediastinal]
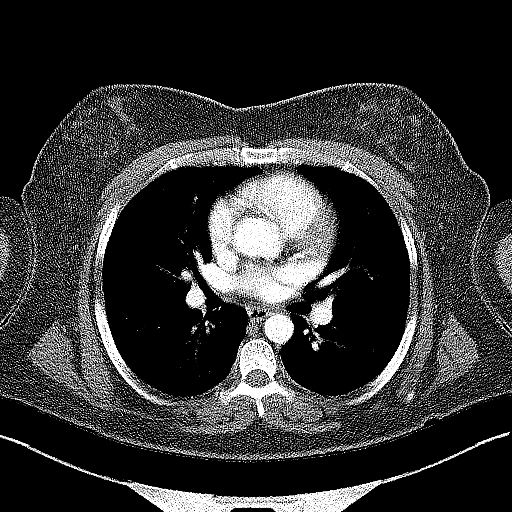
[im 43/61  mediastinal]
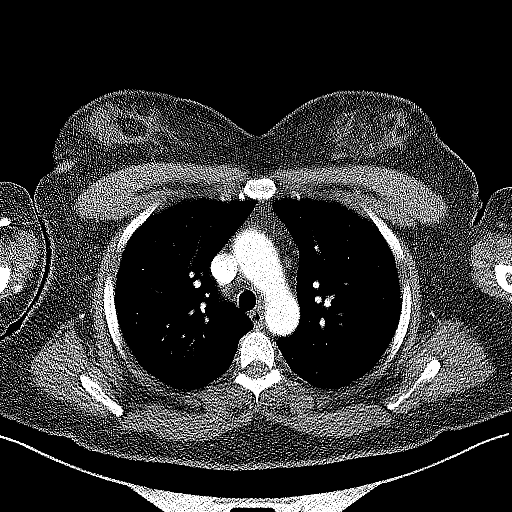
[im 52/61  mediastinal]
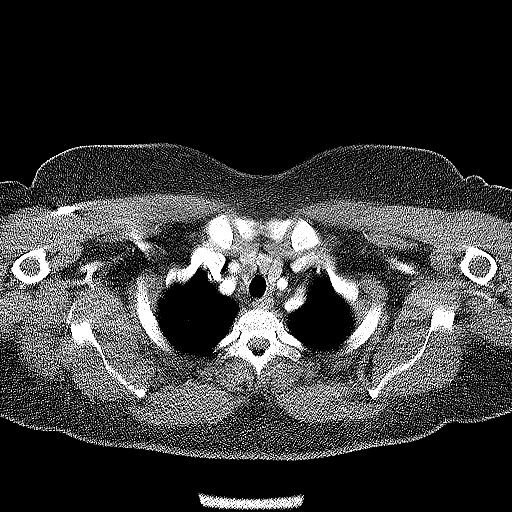

[Series 16: coronals · coronal · 0.60mm/px · 1 of 137 slices shown]
[im 69/137  mediastinal]
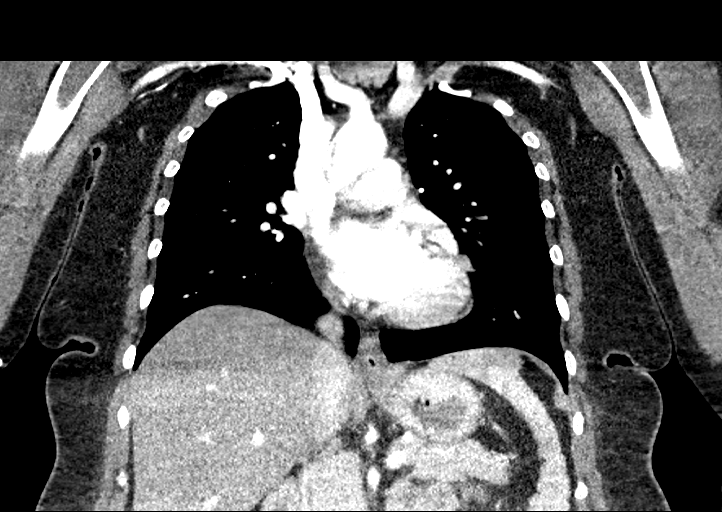

[18 of 36 positions shown; findings below may reference images not displayed]

FINDINGS: Cardiovascular: There is no cardiomegaly or pericardial effusion.
The thoracic aorta appears unremarkable. The origins of the great
vessels of the aortic arch appear patent. There is suboptimal
enhancement and filling of anterior basal branch of the left lower
lobe pulmonary artery (series 14 images 52, 53) which may be
artifactual. Contrast is noted within the more distal branches of
this vessel. However a nonocclusive thrombus is not excluded. V/Q
scan may provide better evaluation if there is clinical concern for
PE. The remainder of the pulmonary arteries appear patent.

Mediastinum/Nodes: No enlarged mediastinal, hilar, or axillary lymph
nodes. Thyroid gland, trachea, and esophagus demonstrate no
significant findings.

Lungs/Pleura: Lungs are clear. No pleural effusion or pneumothorax.

Upper Abdomen: No acute abnormality.

Musculoskeletal: No chest wall abnormality. No acute or significant
osseous findings.

Review of the MIP images confirms the above findings.
IMPRESSION: Artifact versus pulmonary embolus in the anterior basal left lower
lobe pulmonary artery branch. Clinical correlation is recommended.
V/Q scan may provide better evaluation if there is high clinical
concern for PE. No other acute intrathoracic pathology identified.

These results were called by telephone at the time of interpretation
on 11/09/2016 at [DATE] to Dr. DIAMELA CANOVA , who verbally
acknowledged these results.
